# Patient Record
Sex: Female | Born: 1990 | Race: White | Hispanic: No | Marital: Single | Smoking: Never smoker
Health system: Southern US, Community
[De-identification: ages and names within clinical notes are randomized; demographics above are authoritative.]

---
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
## 2013-11-23 ENCOUNTER — Emergency Department (HOSPITAL_COMMUNITY)
Admission: EM | Admit: 2013-11-23 | Discharge: 2013-11-23 | Disposition: A | Discharge status: Home / Self Care | Payer: BC Managed Care – PPO | Attending: Emergency Medicine | Admitting: Emergency Medicine

## 2013-11-23 ENCOUNTER — Encounter (HOSPITAL_COMMUNITY): Payer: Self-pay | Admitting: Emergency Medicine

## 2013-11-23 DIAGNOSIS — Y929 Unspecified place or not applicable: Secondary | ICD-10-CM | POA: Insufficient documentation

## 2013-11-23 DIAGNOSIS — IMO0002 Reserved for concepts with insufficient information to code with codable children: Secondary | ICD-10-CM | POA: Insufficient documentation

## 2013-11-23 DIAGNOSIS — Z880 Allergy status to penicillin: Secondary | ICD-10-CM | POA: Insufficient documentation

## 2013-11-23 DIAGNOSIS — Y939 Activity, unspecified: Secondary | ICD-10-CM | POA: Insufficient documentation

## 2013-11-23 DIAGNOSIS — T185XXA Foreign body in anus and rectum, initial encounter: Secondary | ICD-10-CM | POA: Insufficient documentation

## 2013-11-23 NOTE — ED Provider Notes (Signed)
CSN: [id number]     Arrival date & time 11/23/13  0236 History   First MD Initiated Contact with Patient 11/23/13 [id number]     Chief Complaint  Patient presents with  . Foreign Body in Rectum     (Consider location/radiation/quality/duration/timing/severity/associated sxs/prior Treatment) Patient is a 23 y.o. female presenting with foreign body in rectum. The history is provided by the patient.  Foreign Body in Rectum  She had inserted a plug into her rectum and it went all the way into the rectum and she's been unable to retrieve it. This occurred at about 11:30 PM. She does admit to drinking 2 glasses of wine earlier in the evening. She is not complaining of any pain. She has tried to remove it at home but has been unsuccessful.  History reviewed. No pertinent past medical history. History reviewed. No pertinent past surgical history. No family history on file. History  Substance Use Topics  . Smoking status: Never Smoker   . Smokeless tobacco: Not on file  . Alcohol Use: Yes   OB History   Grav Para Term Preterm Abortions TAB SAB Ect Mult Living                 Review of Systems  All other systems reviewed and are negative.     Allergies  Penicillins  Home Medications   Prior to Admission medications   Not on File   BP 146/77  Pulse 91  Temp(Src) 98.2 F (36.8 C) (Oral)  Resp 14  Ht 5\' 7"  (1.702 m)  Wt 180 lb (81.647 kg)  BMI 28.19 kg/m2  SpO2 100%  LMP 11/12/2013 Physical Exam  Nursing note and vitals reviewed.  23 year old female, resting comfortably and in no acute distress. Vital signs are significant for mild hypertension with blood pressure 146/77. Oxygen saturation is 100%, which is normal. Head is normocephalic and atraumatic. PERRLA, EOMI. Oropharynx is clear. Neck is nontender and supple without adenopathy or JVD. Back is nontender and there is no CVA tenderness. Lungs are clear without rales, wheezes, or rhonchi. Chest is nontender. Heart has  regular rate and rhythm without murmur. Abdomen is soft, flat, nontender without masses or hepatosplenomegaly and peristalsis is normoactive. Rectal: Normal sphincter tone is present. Foreign body is palpable within the rectum. Extremities have no cyanosis or edema, full range of motion is present. Skin is warm and dry without rash. Neurologic: Mental status is normal, cranial nerves are intact, there are no motor or sensory deficits.  ED Course  FOREIGN BODY REMOVAL Date/Time: 11/23/2013 4:40 AM Performed by: Dione BoozeGLICK, Chandra Asher Authorized by: Preston FleetingGLICK, Darneisha Windhorst Consent: Verbal consent obtained. written consent not obtained. Risks and benefits: risks, benefits and alternatives were discussed Consent given by: patient Patient understanding: patient states understanding of the procedure being performed Patient consent: the patient's understanding of the procedure matches consent given Procedure consent: procedure consent matches procedure scheduled Relevant documents: relevant documents present and verified Site marked: the operative site was marked Required items: required blood products, implants, devices, and special equipment available Patient identity confirmed: verbally with patient and arm band Time out: Immediately prior to procedure a "time out" was called to verify the correct patient, procedure, equipment, support staff and site/side marked as required. Body area: rectum Anesthesia method: none. Patient sedated: no Patient restrained: no Patient cooperative: yes Localization method: palpated. Removal mechanism: digital extraction 1 objects recovered. Objects recovered: butt plug Post-procedure assessment: foreign body removed Patient tolerance: Patient tolerated the procedure well with no immediate  complications.   (including critical care time)   MDM   Final diagnoses:  Foreign body anus/rectum, initial encounter    Rectal foreign body which was successfully  removed.    Dione Boozeavid Nalayah Hitt, MD 11/23/13 [phone]

## 2013-11-23 NOTE — ED Notes (Signed)
I have a small butt plug stuck in my butt per pt.

## 2013-11-23 NOTE — ED Notes (Signed)
MD at bedside. 

## 2016-12-21 ENCOUNTER — Emergency Department (HOSPITAL_COMMUNITY): Payer: 59

## 2016-12-21 ENCOUNTER — Encounter (HOSPITAL_COMMUNITY): Payer: Self-pay

## 2016-12-21 ENCOUNTER — Emergency Department (HOSPITAL_COMMUNITY)
Admission: EM | Admit: 2016-12-21 | Discharge: 2016-12-21 | Disposition: A | Discharge status: Home / Self Care | Payer: 59 | Attending: Emergency Medicine | Admitting: Emergency Medicine

## 2016-12-21 DIAGNOSIS — R1011 Right upper quadrant pain: Secondary | ICD-10-CM | POA: Insufficient documentation

## 2016-12-21 DIAGNOSIS — R109 Unspecified abdominal pain: Secondary | ICD-10-CM

## 2016-12-21 LAB — URINALYSIS, ROUTINE W REFLEX MICROSCOPIC
Bilirubin Urine: NEGATIVE
Glucose, UA: NEGATIVE mg/dL
Ketones, ur: NEGATIVE mg/dL
Leukocytes, UA: NEGATIVE
Nitrite: NEGATIVE
Protein, ur: NEGATIVE mg/dL
Specific Gravity, Urine: 1.003 — ABNORMAL LOW (ref 1.005–1.030)
pH: 6 (ref 5.0–8.0)

## 2016-12-21 LAB — CBC WITH DIFFERENTIAL/PLATELET
Basophils Absolute: 0 10*3/uL (ref 0.0–0.1)
Basophils Relative: 0 %
Eosinophils Absolute: 0.2 10*3/uL (ref 0.0–0.7)
Eosinophils Relative: 2 %
HCT: 45.2 % (ref 36.0–46.0)
Hemoglobin: 15.3 g/dL — ABNORMAL HIGH (ref 12.0–15.0)
Lymphocytes Relative: 27 %
Lymphs Abs: 2 10*3/uL (ref 0.7–4.0)
MCH: 32.3 pg (ref 26.0–34.0)
MCHC: 33.8 g/dL (ref 30.0–36.0)
MCV: 95.6 fL (ref 78.0–100.0)
Monocytes Absolute: 0.6 10*3/uL (ref 0.1–1.0)
Monocytes Relative: 8 %
Neutro Abs: 4.7 10*3/uL (ref 1.7–7.7)
Neutrophils Relative %: 63 %
Platelets: 366 10*3/uL (ref 150–400)
RBC: 4.73 MIL/uL (ref 3.87–5.11)
RDW: 12.6 % (ref 11.5–15.5)
WBC: 7.6 10*3/uL (ref 4.0–10.5)

## 2016-12-21 LAB — COMPREHENSIVE METABOLIC PANEL
ALT: 27 U/L (ref 14–54)
AST: 29 U/L (ref 15–41)
Albumin: 4.1 g/dL (ref 3.5–5.0)
Alkaline Phosphatase: 55 U/L (ref 38–126)
Anion gap: 9 (ref 5–15)
BUN: 9 mg/dL (ref 6–20)
CO2: 27 mmol/L (ref 22–32)
Calcium: 8.9 mg/dL (ref 8.9–10.3)
Chloride: 103 mmol/L (ref 101–111)
Creatinine, Ser: 0.88 mg/dL (ref 0.44–1.00)
GFR calc Af Amer: >60 mL/min (ref >60)
GFR calc non Af Amer: >60 mL/min (ref >60)
Glucose, Bld: 102 mg/dL — ABNORMAL HIGH (ref 65–99)
Potassium: 3.8 mmol/L (ref 3.5–5.1)
Sodium: 139 mmol/L (ref 135–145)
Total Bilirubin: 0.4 mg/dL (ref 0.3–1.2)
Total Protein: 7.8 g/dL (ref 6.5–8.1)

## 2016-12-21 LAB — LIPASE, BLOOD: Lipase: 29 U/L (ref 11–51)

## 2016-12-21 LAB — PREGNANCY, URINE: Preg Test, Ur: NEGATIVE

## 2016-12-21 MED ORDER — NAPROXEN 250 MG PO TABS: 250.0000 mg | ORAL_TABLET | Freq: Two times a day (BID) | ORAL | 0 refills | Status: AC | PRN

## 2016-12-21 MED ORDER — METHOCARBAMOL 500 MG PO TABS: 1000.0000 mg | ORAL_TABLET | Freq: Four times a day (QID) | ORAL | 0 refills | Status: AC | PRN

## 2016-12-21 NOTE — ED Triage Notes (Signed)
Pt reports right flank pain with associated nausea, dysuria since Monday. States that over the course of the past 2 weeks she has had intermittent sharp pains in the same area. Seen at Urgent Care yesterday and dx with musculoskeletal pain.

## 2016-12-21 NOTE — ED Notes (Signed)
Pt I stat hcg less than 5. Ct aware

## 2016-12-21 NOTE — ED Notes (Signed)
Patient unable to provide urine specimen at this time.

## 2016-12-21 NOTE — ED Notes (Signed)
Patient stated she was still unable to provide urine specimen without something to drink. Dr. Clarene DukeMcManus stated water was acceptable.  Patient given water to drink.

## 2016-12-21 NOTE — ED Notes (Signed)
Pt takne to ct 

## 2016-12-21 NOTE — ED Notes (Signed)
Pt states she cannot urinate at this time.

## 2016-12-21 NOTE — ED Notes (Signed)
Pt to bathroom for UA

## 2016-12-21 NOTE — Discharge Instructions (Signed)
Take the prescriptions as directed.  Also take over the counter tylenol, as directed on packaging, as needed for discomfort. Apply moist heat or ice to the area(s) of discomfort, for 15 minutes at a time, several times per day for the next few days.  Do not fall asleep on a heating or ice pack.  Call your regular medical doctor today to schedule a follow up appointment next week.  Return to the Emergency Department immediately if worsening. ° °

## 2016-12-21 NOTE — ED Provider Notes (Signed)
[facility]-EMERGENCY DEPT Provider Note   CSN: [id number] Arrival date & time: 12/21/16  0844     History   Chief Complaint Chief Complaint  Patient presents with  . Flank Pain    HPI Michelle Mendez is a 26 y.o. female.  HPI Pt was seen at 0855. Per pt, c/o gradual onset and persistence of intermittent right sided flank "pain" for the past 2 weeks. Describes the pain as "sharp," "aching," and "coming on at random times." Pt states for the past 4 days, pain has been associated with dysuria and several episodes of N/V. Pt states the pain now will come on after she eats. [facility] and dx with msk pain. Denies diarrhea, no black or blood in stools or emesis, no abd pain, no CP/SOB, no rash, no fevers, no injury, no hematuria.     History reviewed. No pertinent past medical history.  There are no active problems to display for this patient.   History reviewed. No pertinent surgical history.  OB History    No data available       Home Medications    Prior to Admission medications   Not on File    Family History History reviewed. No pertinent family history.  Social History Social History  Substance Use Topics  . Smoking status: Never Smoker  . Smokeless tobacco: Never Used  . Alcohol use Yes     Comment: twice weekly     Allergies   Penicillins   Review of Systems Review of Systems ROS: Statement: All systems negative except as marked or noted in the HPI; Constitutional: Negative for fever and chills. ; ; Eyes: Negative for eye pain, redness and discharge. ; ; ENMT: Negative for ear pain, hoarseness, nasal congestion, sinus pressure and sore throat. ; ; Cardiovascular: Negative for chest pain, palpitations, diaphoresis, dyspnea and peripheral edema. ; ; Respiratory: Negative for cough, wheezing and stridor. ; ; Gastrointestinal: +N/V. Negative for diarrhea, abdominal pain, blood in stool, hematemesis, jaundice and rectal bleeding. . ; ;  Genitourinary: +dysuria, flank pain. Negative for hematuria. ; ;  GYN:  No pelvic pain, no vaginal bleeding, no vaginal discharge, no vulvar pain. ;; Musculoskeletal: Negative for neck pain. Negative for swelling and trauma.; ; Skin: Negative for pruritus, rash, abrasions, blisters, bruising and skin lesion.; ; Neuro: Negative for headache, lightheadedness and neck stiffness. Negative for weakness, altered level of consciousness, altered mental status, extremity weakness, paresthesias, involuntary movement, seizure and syncope.       Physical Exam Updated Vital Signs BP (!) 145/88   Pulse 80   Temp (!) 97.5 F (36.4 C) (Oral)   Ht 5\' 7"  (1.702 m)   Wt 90.7 kg (200 lb)   LMP 12/19/2016   SpO2 100%   BMI 31.32 kg/m   Physical Exam 0900: Physical examination:  Nursing notes reviewed; Vital signs and O2 SAT reviewed;  Constitutional: Well developed, Well nourished, Well hydrated, In no acute distress; Head:  Normocephalic, atraumatic; Eyes: EOMI, PERRL, No scleral icterus; ENMT: Mouth and pharynx normal, Mucous membranes moist; Neck: Supple, Full range of motion, No lymphadenopathy; Cardiovascular: Regular rate and rhythm, No gallop; Respiratory: Breath sounds clear & equal bilaterally, No wheezes.  Speaking full sentences with ease, Normal respiratory effort/excursion; Chest: Nontender, Movement normal; Abdomen: Soft, Nontender, Nondistended, Normal bowel sounds; Genitourinary: No CVA tenderness; Spine:  No midline CS, TS, LS tenderness.;; Extremities: Pulses normal, No tenderness, No edema, No calf edema or asymmetry.; Neuro: AA&Ox3, Major CN  grossly intact.  Speech clear. No gross focal motor or sensory deficits in extremities. Climbs on and off stretcher easily by herself. Gait steady.; Skin: Color normal, Warm, Dry.   ED Treatments / Results  Labs (all labs ordered are listed, but only abnormal results are displayed)   EKG  EKG Interpretation None        Radiology   Procedures Procedures (including critical care time)  Medications Ordered in ED Medications - No data to display   Initial Impression / Assessment and Plan / ED Course  I have reviewed the triage vital signs and the nursing notes.  Pertinent labs & imaging results that were available during my care of the patient were reviewed by me and considered in my medical decision making (see chart for details).  MDM Reviewed: previous chart, nursing note and vitals Interpretation: labs and CT scan    Results for orders placed or performed during the hospital encounter of 12/21/16  Urinalysis, Routine w reflex microscopic  Result Value Ref Range   Color, Urine YELLOW YELLOW   APPearance CLEAR CLEAR   Specific Gravity, Urine 1.003 (L) 1.005 - 1.030   pH 6.0 5.0 - 8.0   Glucose, UA NEGATIVE NEGATIVE mg/dL   Hgb urine dipstick LARGE (A) NEGATIVE   Bilirubin Urine NEGATIVE NEGATIVE   Ketones, ur NEGATIVE NEGATIVE mg/dL   Protein, ur NEGATIVE NEGATIVE mg/dL   Nitrite NEGATIVE NEGATIVE   Leukocytes, UA NEGATIVE NEGATIVE   RBC / HPF 6-30 0 - 5 RBC/hpf   WBC, UA 0-5 0 - 5 WBC/hpf   Bacteria, UA RARE (A) NONE SEEN   Squamous Epithelial / LPF 0-5 (A) NONE SEEN  Comprehensive metabolic panel  Result Value Ref Range   Sodium 139 135 - 145 mmol/L   Potassium 3.8 3.5 - 5.1 mmol/L   Chloride 103 101 - 111 mmol/L   CO2 27 22 - 32 mmol/L   Glucose, Bld 102 (H) 65 - 99 mg/dL   BUN 9 6 - 20 mg/dL   Creatinine, Ser 1.61 0.44 - 1.00 mg/dL   Calcium 8.9 8.9 - 09.6 mg/dL   Total Protein 7.8 6.5 - 8.1 g/dL   Albumin 4.1 3.5 - 5.0 g/dL   AST 29 15 - 41 U/L   ALT 27 14 - 54 U/L   Alkaline Phosphatase 55 38 - 126 U/L   Total Bilirubin 0.4 0.3 - 1.2 mg/dL   GFR calc non Af Amer >60 >60 mL/min   GFR calc Af Amer >60 >60 mL/min   Anion gap 9 5 - 15  Lipase, blood  Result Value Ref Range   Lipase 29 11 - 51 U/L  CBC with Differential  Result Value Ref Range   WBC 7.6 4.0 - 10.5  K/uL   RBC 4.73 3.87 - 5.11 MIL/uL   Hemoglobin 15.3 (H) 12.0 - 15.0 g/dL   HCT 04.5 40.9 - 81.1 %   MCV 95.6 78.0 - 100.0 fL   MCH 32.3 26.0 - 34.0 pg   MCHC 33.8 30.0 - 36.0 g/dL   RDW 91.4 78.2 - 95.6 %   Platelets 366 150 - 400 K/uL   Neutrophils Relative % 63 %   Neutro Abs 4.7 1.7 - 7.7 K/uL   Lymphocytes Relative 27 %   Lymphs Abs 2.0 0.7 - 4.0 K/uL   Monocytes Relative 8 %   Monocytes Absolute 0.6 0.1 - 1.0 K/uL   Eosinophils Relative 2 %   Eosinophils Absolute 0.2 0.0 - 0.7 K/uL  Basophils Relative 0 %   Basophils Absolute 0.0 0.0 - 0.1 K/uL  Pregnancy, urine  Result Value Ref Range   Preg Test, Ur NEGATIVE NEGATIVE   Ct Renal Stone Study Result Date: 12/21/2016 CLINICAL DATA:  Right flank pain, 5 days duration. EXAM: CT ABDOMEN AND PELVIS WITHOUT CONTRAST TECHNIQUE: Multidetector CT imaging of the abdomen and pelvis was performed following the standard protocol without IV contrast. COMPARISON:  None. FINDINGS: Lower chest: Normal Hepatobiliary: Normal Pancreas: Normal Spleen: Normal Adrenals/Urinary Tract: Adrenal glands are normal. Both kidneys are normal. No cyst, mass, stone or hydronephrosis. No stone along the course of either ureter. No stone in the bladder. Stomach/Bowel: Normal. No sign of obstruction, inflammation or other bowel pathology. No sign of inflamed appendix. Vascular/Lymphatic: Normal Reproductive: Normal Other: No free fluid or air. Musculoskeletal: Normal IMPRESSION: Normal examination. No cause of right flank pain identified. No evidence of urinary tract stone disease or other right-sided pathology. Electronically Signed   By: Paulina FusiMark  Shogry M.D.   On: 12/21/2016 12:01    1245:  Workup reassuring. Tx symptomatically at this time. Dx and testing d/w pt and family.  Questions answered.  Verb understanding, agreeable to d/c home with outpt f/u.   Final diagnoses:  None    New Prescriptions New Prescriptions   No medications on file     Samuel JesterMcManus,  Zander Ingham, DO 12/24/16 2153

## 2018-05-15 IMAGING — CT CT RENAL STONE PROTOCOL
2 of 3 series · 17 of 46 positions shown, 19 images · non-contrast
Comparison: None.

CLINICAL DATA: Right flank pain, 5 days duration.

EXAM:
CT ABDOMEN AND PELVIS WITHOUT CONTRAST
TECHNIQUE: Multidetector CT imaging of the abdomen and pelvis was performed
following the standard protocol without IV contrast.

[Series 2: axial st · axial · 0.69mm/px · z∈[+1054,+1454]mm · 14 of 92 slices shown, 16 images]
[im 6/92  soft-tissue]
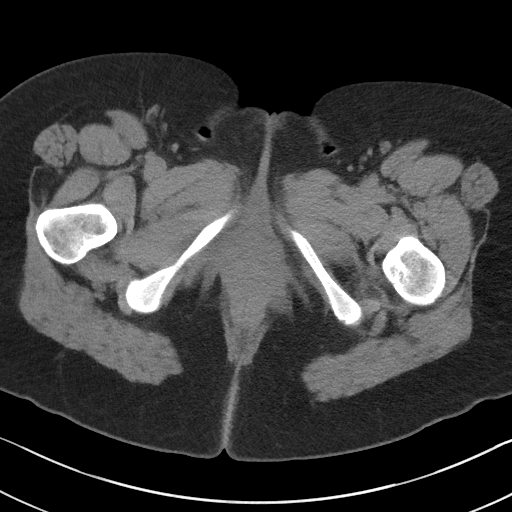
[im 6/92  bone]
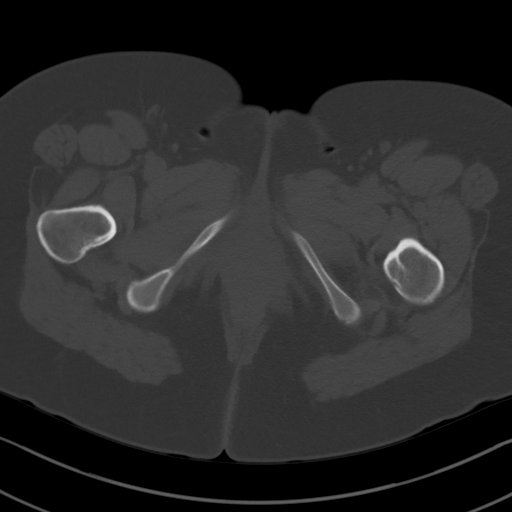
[im 12/92  soft-tissue]
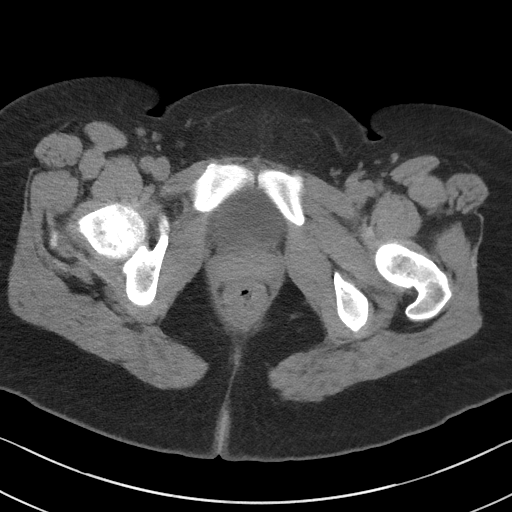
[im 18/92  soft-tissue]
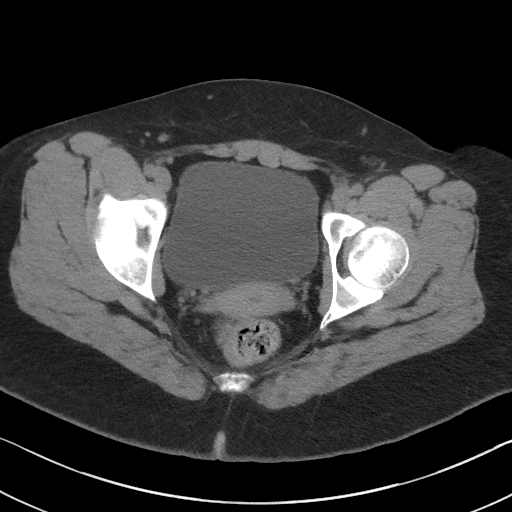
[im 24/92  soft-tissue]
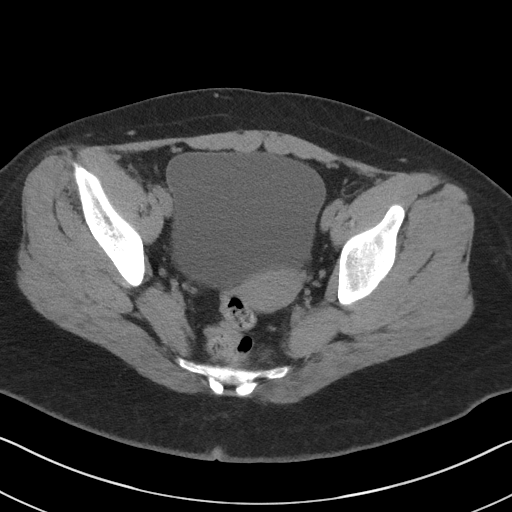
[im 30/92  soft-tissue]
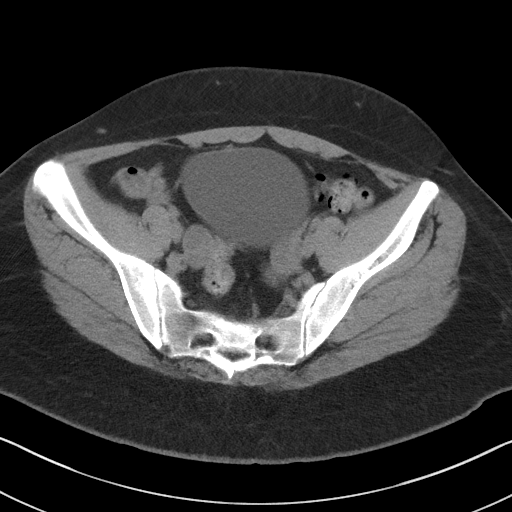
[im 36/92  soft-tissue]
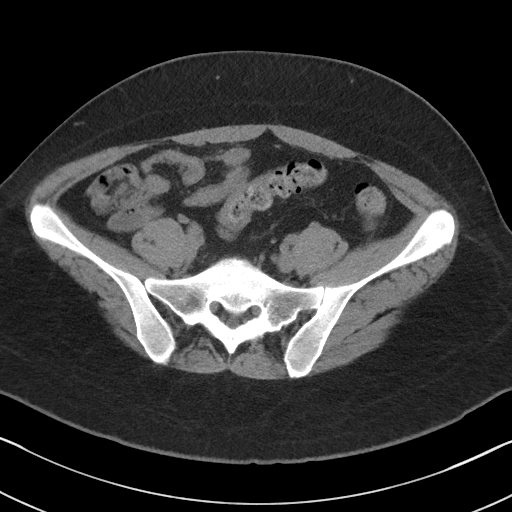
[im 42/92  soft-tissue]
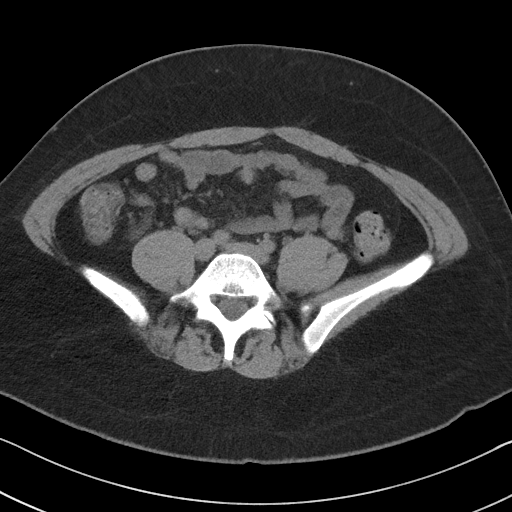
[im 50/92  soft-tissue]
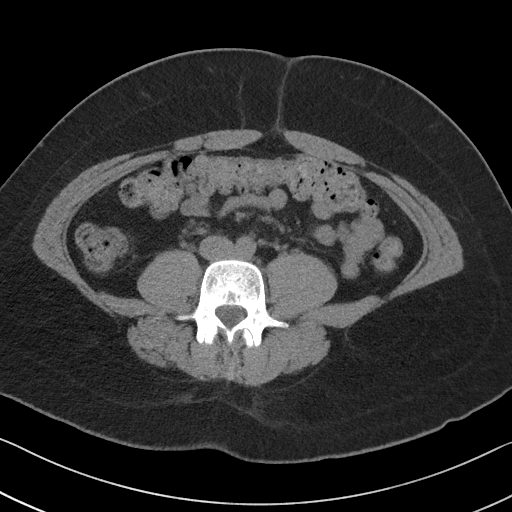
[im 56/92  soft-tissue]
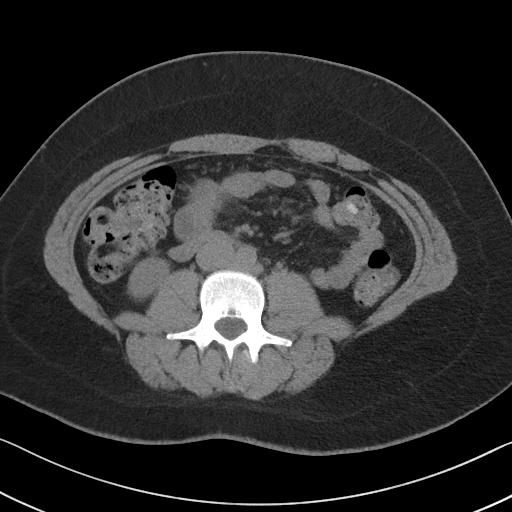
[im 56/92  bone]
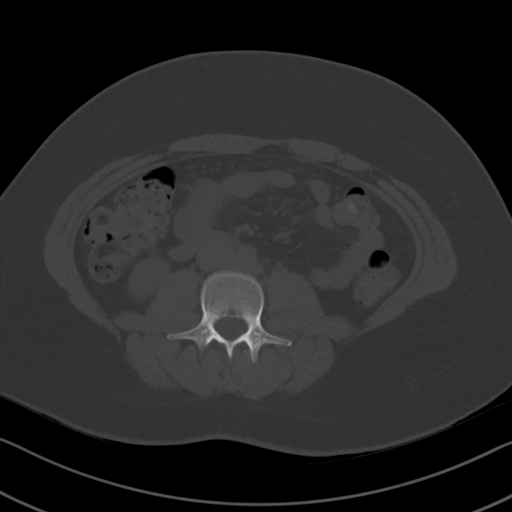
[im 62/92  soft-tissue]
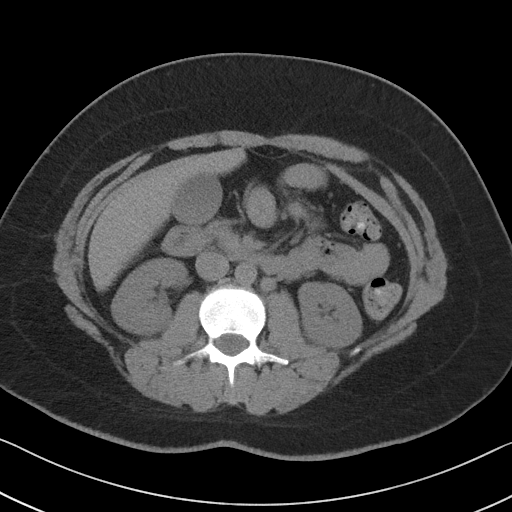
[im 68/92  soft-tissue]
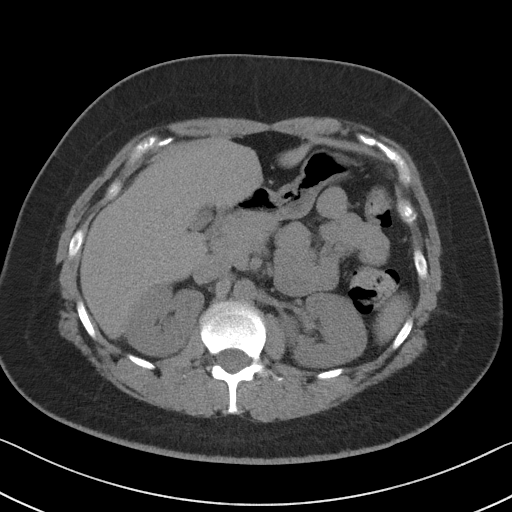
[im 74/92  soft-tissue]
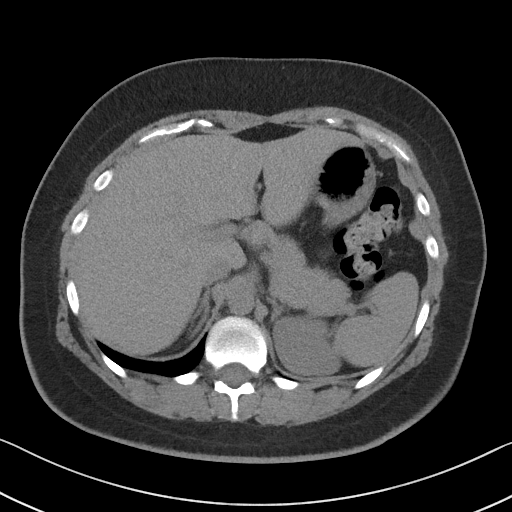
[im 80/92  soft-tissue]
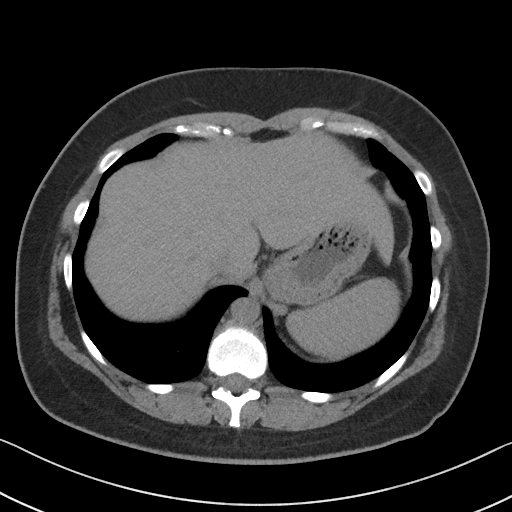
[im 86/92  soft-tissue]
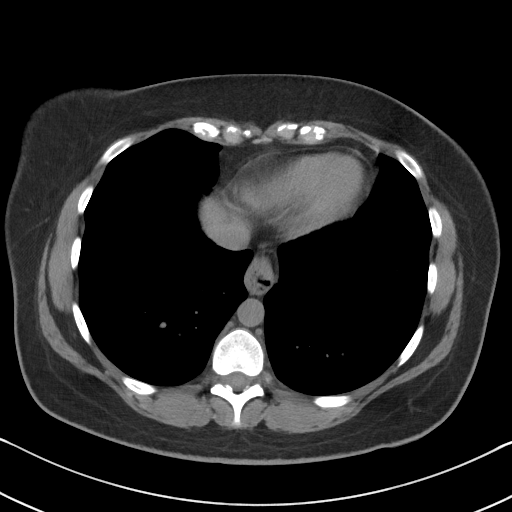

[Series 5: coronal st · coronal · 0.77mm/px · 3 of 87 slices shown]
[im 29/87  soft-tissue]
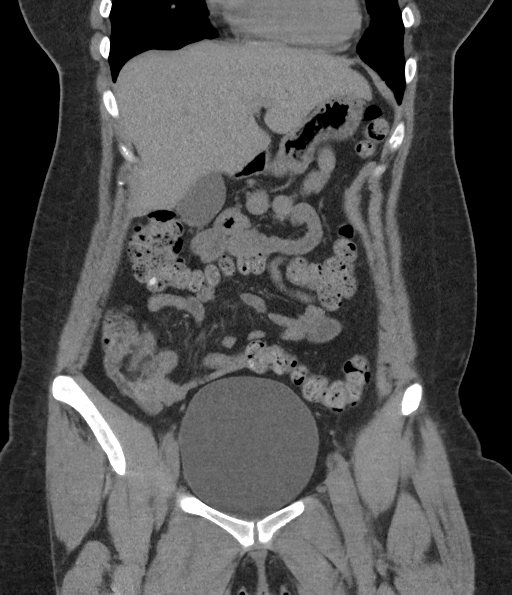
[im 39/87  soft-tissue]
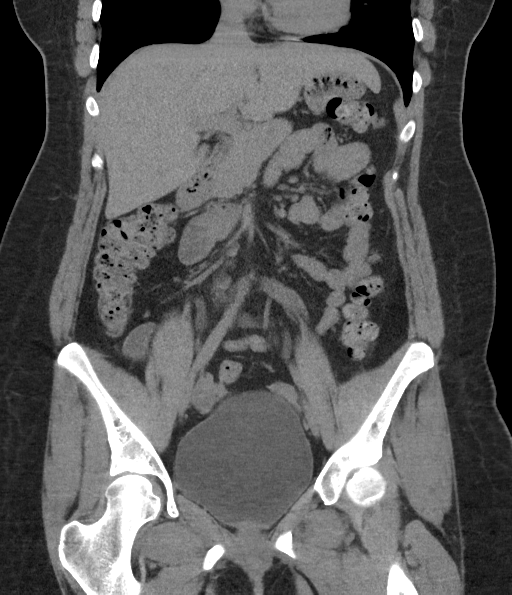
[im 48/87  soft-tissue]
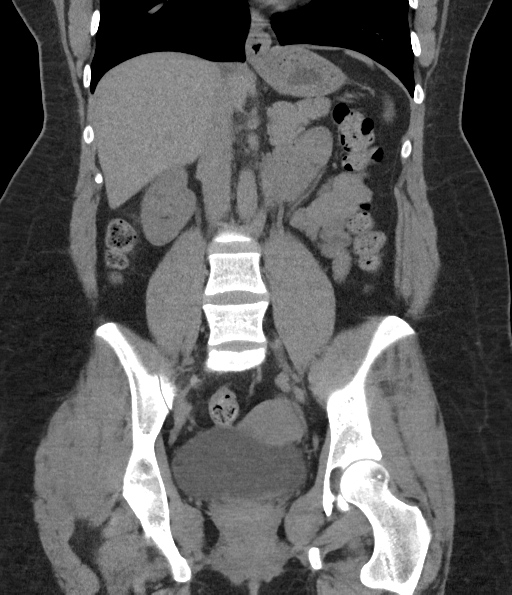

[17 of 46 positions shown; findings below may reference images not displayed]

FINDINGS: Lower chest: Normal

Hepatobiliary: Normal

Pancreas: Normal

Spleen: Normal

Adrenals/Urinary Tract: Adrenal glands are normal. Both kidneys are
normal. No cyst, mass, stone or hydronephrosis. No stone along the
course of either ureter. No stone in the bladder.

Stomach/Bowel: Normal. No sign of obstruction, inflammation or other
bowel pathology. No sign of inflamed appendix.

Vascular/Lymphatic: Normal

Reproductive: Normal

Other: No free fluid or air.

Musculoskeletal: Normal
IMPRESSION: Normal examination. No cause of right flank pain identified. No
evidence of urinary tract stone disease or other right-sided
pathology.

## 2020-03-31 ENCOUNTER — Other Ambulatory Visit: Payer: Self-pay

## 2020-03-31 DIAGNOSIS — Z20822 Contact with and (suspected) exposure to covid-19: Secondary | ICD-10-CM

## 2020-04-01 LAB — NOVEL CORONAVIRUS, NAA

## 2020-04-02 ENCOUNTER — Telehealth: Payer: Self-pay | Admitting: General Practice

## 2020-04-02 NOTE — Telephone Encounter (Signed)
Pt viewed her covid 19 result on mychart  and it was determine that [facility] needs her to repeat. Pt will consult with her husband and may call back

## 2020-04-04 ENCOUNTER — Other Ambulatory Visit: Payer: Self-pay

## 2020-04-04 DIAGNOSIS — Z20822 Contact with and (suspected) exposure to covid-19: Secondary | ICD-10-CM

## 2020-04-05 LAB — SARS-COV-2, NAA 2 DAY TAT

## 2020-04-05 LAB — NOVEL CORONAVIRUS, NAA: SARS-CoV-2, NAA: DETECTED — AB

## 2020-04-06 ENCOUNTER — Telehealth (HOSPITAL_COMMUNITY): Payer: Self-pay

## 2020-04-06 NOTE — Telephone Encounter (Signed)
Called to Discuss with patient about Covid symptoms and the use of the monoclonal antibody infusion for those with mild to moderate Covid symptoms and at a high risk of hospitalization.     Pt refused treatment with monoclonal antibody infusion.

## 2020-04-06 NOTE — Telephone Encounter (Signed)
Called to Discuss with patient about Covid symptoms and the use of the monoclonal antibody infusion for those with mild to moderate Covid symptoms and at a high risk of hospitalization.     Pt appears to qualify for this infusion due to co-morbid conditions and/or a member of an at-risk group in accordance with the FDA Emergency Use Authorization.    Unable to reach pt, left HIPPA compliant message to return phone call.
